# Patient Record
Sex: Male | Born: 1945 | Race: White | Hispanic: No | Marital: Married
Health system: Southern US, Community
[De-identification: ages and names within clinical notes are randomized; demographics above are authoritative.]

---

## 2014-03-24 ENCOUNTER — Other Ambulatory Visit (HOSPITAL_COMMUNITY): Payer: Self-pay | Admitting: Neurosurgery

## 2014-03-24 DIAGNOSIS — M5416 Radiculopathy, lumbar region: Secondary | ICD-10-CM

## 2014-03-31 ENCOUNTER — Ambulatory Visit (HOSPITAL_COMMUNITY)
Admission: RE | Admit: 2014-03-31 | Discharge: 2014-03-31 | Disposition: A | Discharge status: Home / Self Care | Payer: Worker's Compensation | Source: Ambulatory Visit | Attending: Neurosurgery | Admitting: Neurosurgery

## 2014-03-31 DIAGNOSIS — M5416 Radiculopathy, lumbar region: Secondary | ICD-10-CM | POA: Diagnosis present

## 2014-03-31 MED ORDER — DIAZEPAM 5 MG PO TABS
10.0000 mg | ORAL_TABLET | Freq: Once | ORAL | Status: AC
  Administered 2014-03-31: 10 mg via ORAL

## 2014-03-31 MED ORDER — ONDANSETRON HCL 4 MG/2ML IJ SOLN: 4.0000 mg | Freq: Four times a day (QID) | INTRAMUSCULAR | Status: DC | PRN

## 2014-03-31 MED ORDER — DIAZEPAM 5 MG PO TABS
ORAL_TABLET | ORAL | Status: AC
  Filled 2014-03-31: qty 2

## 2014-03-31 MED ORDER — IOHEXOL 180 MG/ML  SOLN
20.0000 mL | Freq: Once | INTRAMUSCULAR | Status: AC | PRN
  Administered 2014-03-31: 16 mL via INTRATHECAL

## 2014-03-31 NOTE — Discharge Instructions (Signed)
Myelography, Care After These instructions give you information on caring for yourself after your procedure. Your doctor may also give you specific instructions. Call your doctor if you have any problems or questions after your procedure. HOME CARE  Rest often the first day.  When you rest, lie flat, with your head slightly raised (elevated).  Avoid heavy lifting and activity for 48 hours. You may take the bandage (dressing) off 1 day after the test.   Myelogram and Lumbar Puncture Discharge Instructions  Go home and rest quietly for the next 24 hours.  It is important to lie flat for the next 24 hours.  Get up only to go to the restroom.  You may lie in the bed or on a couch on your back, your stomach, your left side or your right side.  You may have one pillow under your head.  You may have pillows between your knees while you are on your side or under your knees while you are on your back.Myelogram and Lumbar Puncture Discharge Instructions  Go home and rest quietly for the next 24 hours.  It is important to lie flat for the next 24 hours.  Get up only to go to the restroom.  You may lie in the bed or on a couch on your back, your stomach, your left side or your right side.  You may have one pillow under your head.  You may have pillows between your knees while you are on your side or under your knees while you are on your back.  DO NOT drive today.  Recline the seat as far back as it will go, while still wearing your seat belt, on the way home.  You may get up to go to the bathroom as needed.  You may sit up for 10 minutes to eat.  You may resume your normal diet and medications unless otherwise indicated.  The incidence of headache, nausea, or vomiting is about 5% (one in 20 patients).  If you develop a headache, lie flat and drink plenty of fluids until the headache goes away.  Caffeinated beverages may be helpful.  If you develop severe nausea and vomiting or a headache that does not go  away with flat bed rest, call your Doctor.  You may resume normal activities after your 24 hours of bed rest is over; however, do not exert yourself strongly or do any heavy lifting tomorrow.  Call your physician for a follow-up appointment.  The results of your myelogram will be sent directly to your physician by the following day.  If you have any questions or if complications develop after you arrive home.   Discharge instructions have been explained to the patient.  The patient, or the person responsible for the patient, fully understands these instructions.  1.   2. DO NOT drive today.  Recline the seat as far back as it will go, while still wearing your seat belt, on the way home.  3. You may get up to go to the bathroom as needed.  You may sit up for 10 minutes to eat.  You may resume your normal diet and medications unless otherwise indicated.  4. The incidence of headache, nausea, or vomiting is about 5% (one in 20 patients).  If you develop a headache, lie flat and drink plenty of fluids until the headache goes away.  Caffeinated beverages may be helpful.  If you develop severe nausea and vomiting or a headache that does not go away  with flat bed rest, call your Doctor.  5. You may resume normal activities after your 24 hours of bed rest is over; however, do not exert yourself strongly or do any heavy lifting tomorrow.  6. Call your physician for a follow-up appointment.  The results of your myelogram will be sent directly to your physician by the following day.  7. If you have any questions or if complications develop after you arrive home your doctor.  Discharge instructions have been explained to the patient.  The patient, or the person responsible for the patient, fully understands these instructions.    GET HELP RIGHT AWAY IF:  8. You have a very bad headache. 9. You have a fever. MAKE SURE YOU:  Understand these instructions.  Will watch your condition.  Will get  help right away if you are not doing well or get worse. Document Released: 10/05/2007 Document Revised: 05/12/2013 Document Reviewed: 04/16/2013 [facility] Patient Information 2015 [place], [place]. This information is not intended to replace advice given to you by your health care provider. Make sure you discuss any questions you have with your health care provider.

## 2014-12-09 ENCOUNTER — Other Ambulatory Visit: Payer: Self-pay | Admitting: Neurosurgery

## 2014-12-09 ENCOUNTER — Other Ambulatory Visit (HOSPITAL_COMMUNITY): Payer: Self-pay | Admitting: Neurosurgery

## 2014-12-09 DIAGNOSIS — M5126 Other intervertebral disc displacement, lumbar region: Secondary | ICD-10-CM

## 2014-12-22 ENCOUNTER — Ambulatory Visit (HOSPITAL_COMMUNITY)
Admission: RE | Admit: 2014-12-22 | Discharge: 2014-12-22 | Disposition: A | Discharge status: Home / Self Care | Payer: Worker's Compensation | Source: Ambulatory Visit | Attending: Neurosurgery | Admitting: Neurosurgery

## 2014-12-22 DIAGNOSIS — M545 Low back pain: Secondary | ICD-10-CM | POA: Insufficient documentation

## 2014-12-22 DIAGNOSIS — M5127 Other intervertebral disc displacement, lumbosacral region: Secondary | ICD-10-CM | POA: Diagnosis not present

## 2014-12-22 DIAGNOSIS — I700 Atherosclerosis of aorta: Secondary | ICD-10-CM | POA: Insufficient documentation

## 2014-12-22 DIAGNOSIS — K5730 Diverticulosis of large intestine without perforation or abscess without bleeding: Secondary | ICD-10-CM | POA: Insufficient documentation

## 2014-12-22 DIAGNOSIS — M4806 Spinal stenosis, lumbar region: Secondary | ICD-10-CM | POA: Diagnosis not present

## 2014-12-22 DIAGNOSIS — M5126 Other intervertebral disc displacement, lumbar region: Secondary | ICD-10-CM

## 2014-12-22 DIAGNOSIS — M79604 Pain in right leg: Secondary | ICD-10-CM | POA: Insufficient documentation

## 2014-12-22 MED ORDER — DIAZEPAM 5 MG PO TABS
ORAL_TABLET | ORAL | Status: AC
  Administered 2014-12-22: 10 mg via ORAL
  Filled 2014-12-22: qty 2

## 2014-12-22 MED ORDER — DIAZEPAM 5 MG PO TABS
10.0000 mg | ORAL_TABLET | Freq: Once | ORAL | Status: AC
  Administered 2014-12-22: 10 mg via ORAL

## 2014-12-22 MED ORDER — ONDANSETRON HCL 4 MG/2ML IJ SOLN: 4.0000 mg | Freq: Four times a day (QID) | INTRAMUSCULAR | Status: DC | PRN

## 2014-12-22 MED ORDER — LIDOCAINE HCL (PF) 1 % IJ SOLN
INTRAMUSCULAR | Status: AC
  Administered 2014-12-22: 5 mL via INTRAMUSCULAR
  Filled 2014-12-22: qty 5

## 2014-12-22 MED ORDER — TRAMADOL HCL 50 MG PO TABS
100.0000 mg | ORAL_TABLET | Freq: Four times a day (QID) | ORAL | Status: DC | PRN
  Filled 2014-12-22: qty 2

## 2014-12-22 MED ORDER — IOHEXOL 180 MG/ML  SOLN
20.0000 mL | Freq: Once | INTRAMUSCULAR | Status: AC | PRN
  Administered 2014-12-22: 20 mL via INTRATHECAL

## 2014-12-22 NOTE — Op Note (Signed)
12/22/2014 lumbar Myelogram  PATIENT:  Raymond BoozeKeith Pierce is a 69 y.o. male with right lower extremity pain.   PRE-OPERATIVE DIAGNOSIS:  Right lower extremity pain  POST-OPERATIVE DIAGNOSIS:  Right lower extremity pain  PROCEDURE:  Lumbar Myelogram  SURGEON:  Zeffie Bickert  ANESTHESIA:   local LOCAL MEDICATIONS USED:  LIDOCAINE  Procedure Note: Raymond Pierce is a 69 y.o. male Was taken to the fluoroscopy suite and  positioned prone on the fluoroscopy table. His back was prepared and draped in a sterile manner. I infiltrated 7 cc into the lumbar region. I then introduced a spinal needle into the thecal sac at the L4/5 interlaminar space. I infiltrated 20cc of Omnipaque 180 into the thecal sac. Fluoroscopy showed the needle and contrast in the thecal sac. Raymond Pierce tolerated the procedure well. he Will be taken to CT for evaluation.     PATIENT DISPOSITION:  PACU - hemodynamically stable.

## 2014-12-22 NOTE — Discharge Instructions (Signed)
Myelogram and Lumbar Puncture Discharge Instructions ° °1. Go home and rest quietly for the next 24 hours.  It is important to lie flat for the next 24 hours.  Get up only to go to the restroom.  You may lie in the bed or on a couch on your back, your stomach, your left side or your right side.  You may have one pillow under your head.  You may have pillows between your knees while you are on your side or under your knees while you are on your back. ° °2. DO NOT drive today.  Recline the seat as far back as it will go, while still wearing your seat belt, on the way home. ° °3. You may get up to go to the bathroom as needed.  You may sit up for 10 minutes to eat.  You may resume your normal diet and medications unless otherwise indicated. ° °4. The incidence of headache, nausea, or vomiting is about 5% (one in 20 patients).  If you develop a headache, lie flat and drink plenty of fluids until the headache goes away.  Caffeinated beverages may be helpful.  If you develop severe nausea and vomiting or a headache that does not go away with flat bed rest, call the office. ° °5. You may resume normal activities after your 24 hours of bed rest is over; however, do not exert yourself strongly or do any heavy lifting tomorrow. ° °6. Call your physician for a follow-up appointment.  The results of your myelogram will be sent directly to your physician by the following day. ° °7. If you have any questions or if complications develop after you arrive home, please call the office. ° °Discharge instructions have been explained to the patient.  The patient, or the person responsible for the patient, fully understands these instructions. ° ° °

## 2017-07-12 IMAGING — RF DG MYELOGRAM LUMBAR
9 series · 9 of 9 positions shown · non-contrast
Comparison: Lumbar CT myelogram 03/31/2014.

CLINICAL DATA: 68-year-old male with lumbar back pain radiating to
the right hip and posterior lower extremity stopping at the knee.
Cardiac pacemaker. Subsequent encounter.
TECHNIQUE: Contiguous axial images were obtained through the Lumbar spine after
the intrathecal infusion of infusion. Coronal and sagittal
reconstructions were obtained of the axial image sets.

[Series 1: run · 1 of 1 slices shown (1 of 9)]
[im 1/1]
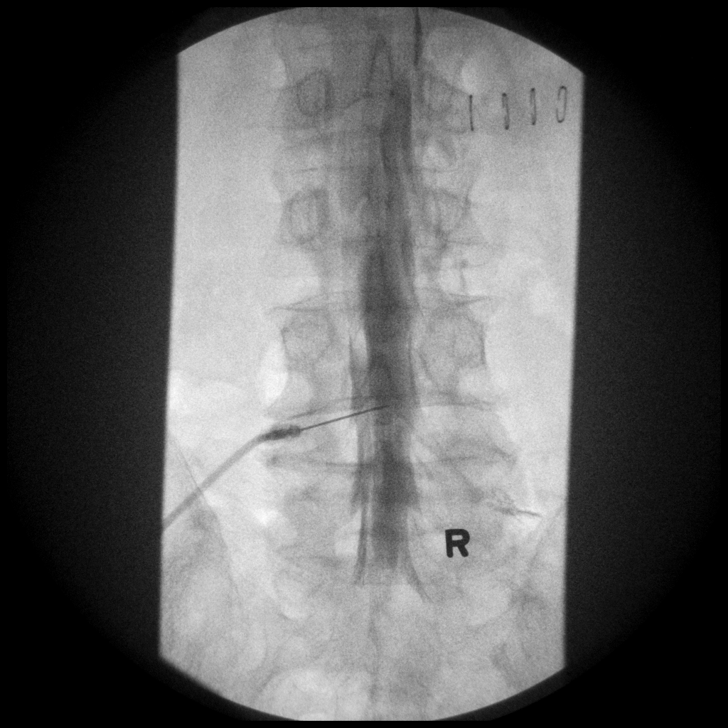

[Series 2: run · 1 of 1 slices shown (2 of 9)]
[im 1/1]
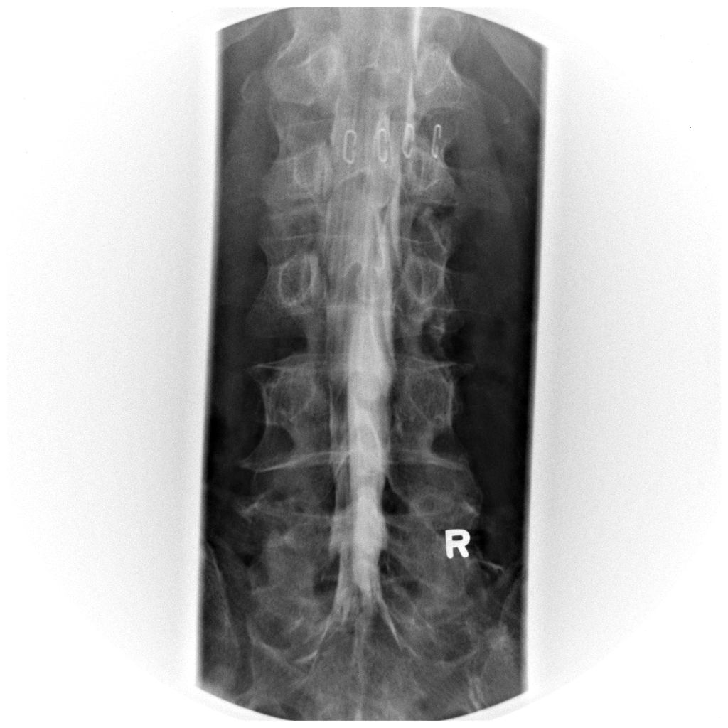

[Series 3: run · 1 of 1 slices shown (3 of 9)]
[im 1/1]
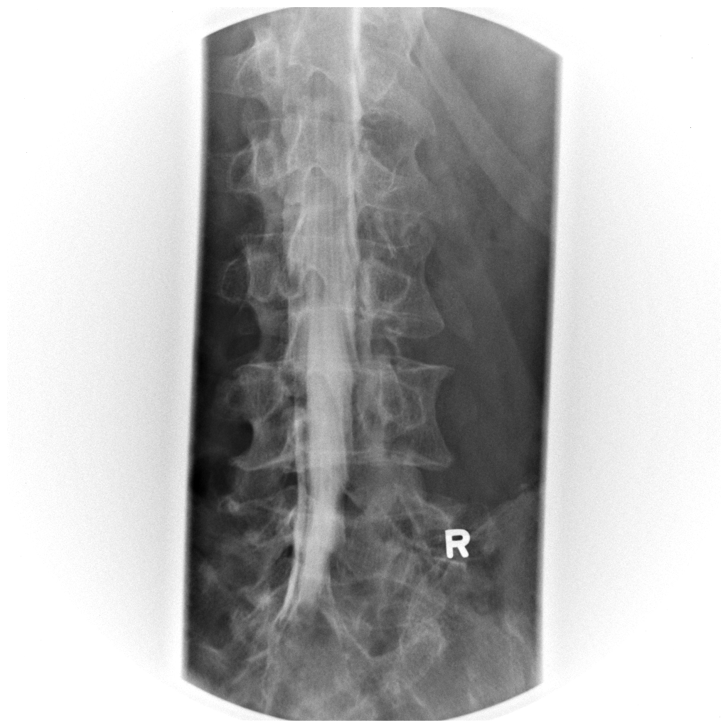

[Series 4: run · 1 of 1 slices shown (4 of 9)]
[im 1/1]
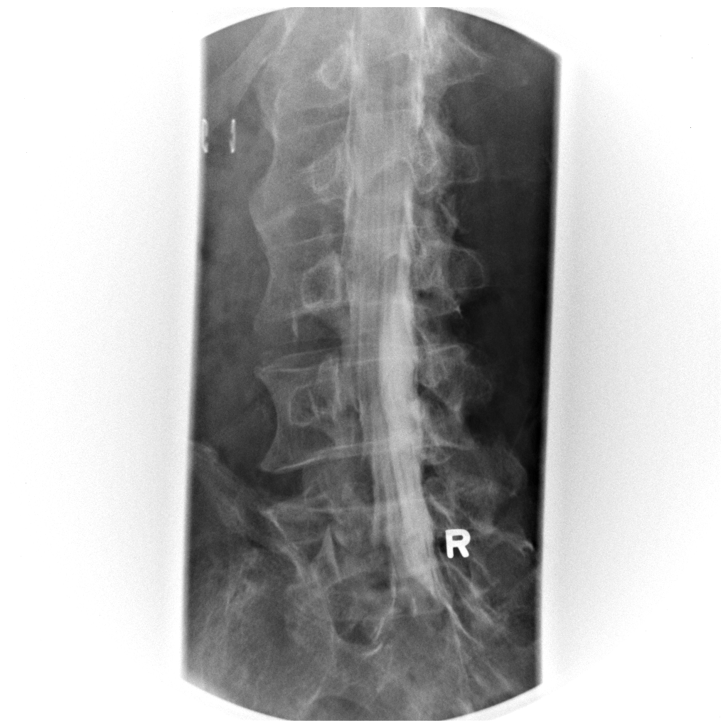

[Series 5: run · 1 of 1 slices shown (5 of 9)]
[im 1/1]
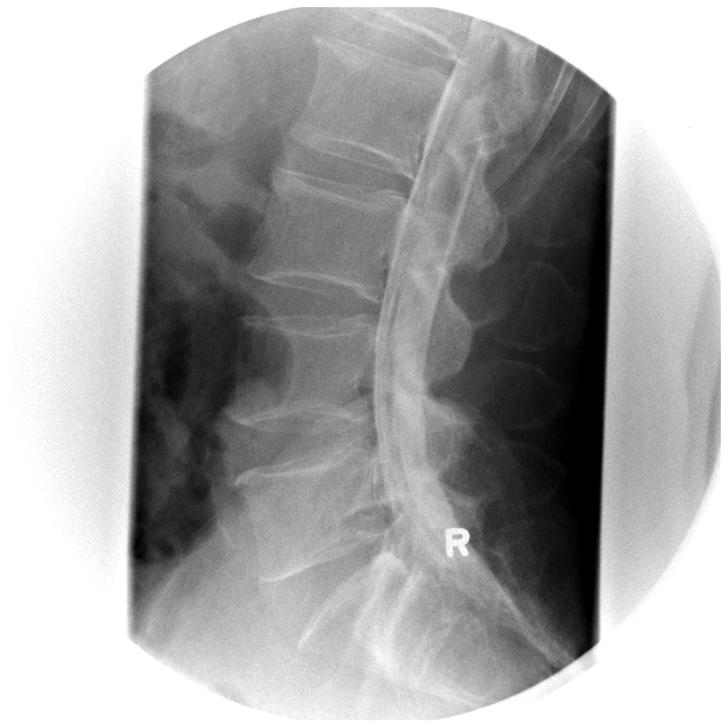

[Series 6: run · 1 of 1 slices shown (6 of 9)]
[im 1/1]
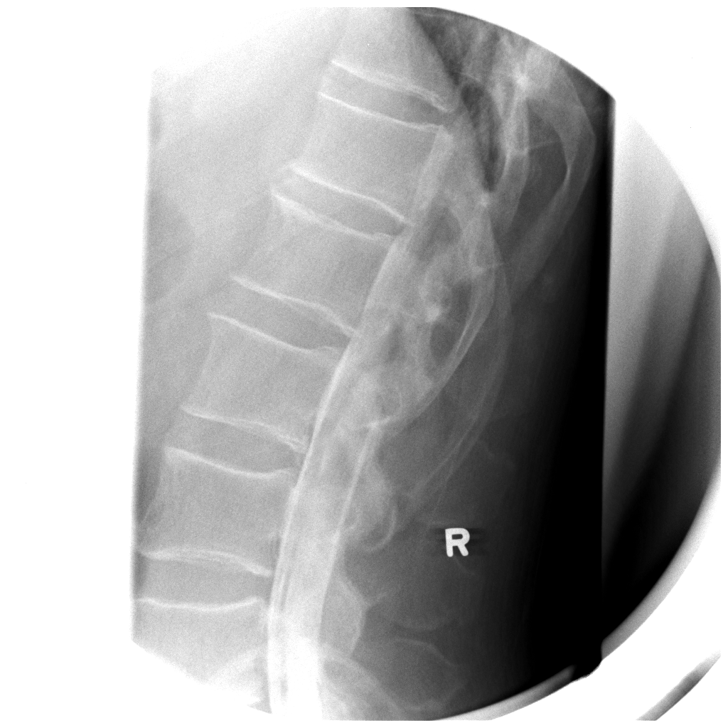

[Series 7: run · 1 of 1 slices shown (7 of 9)]
[im 1/1]
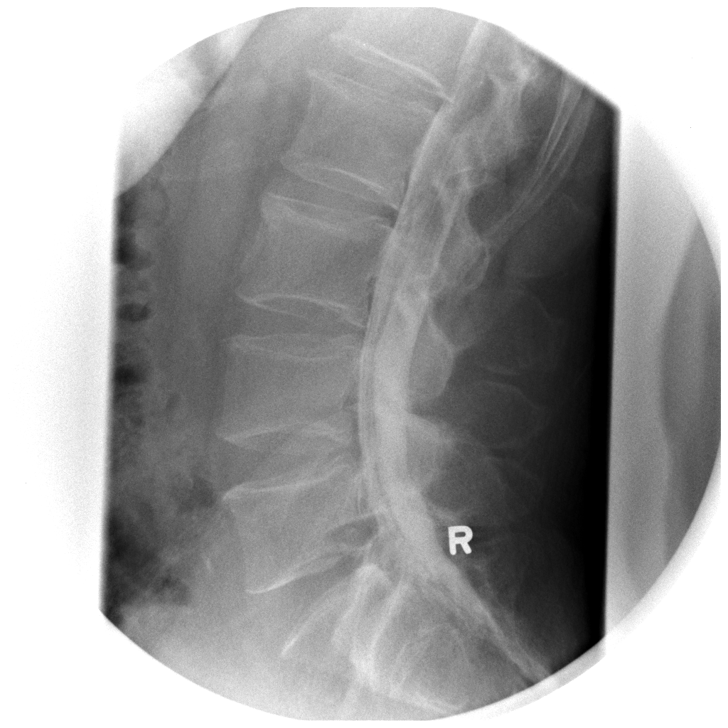

[Series 8: run · 1 of 1 slices shown (8 of 9)]
[im 1/1]
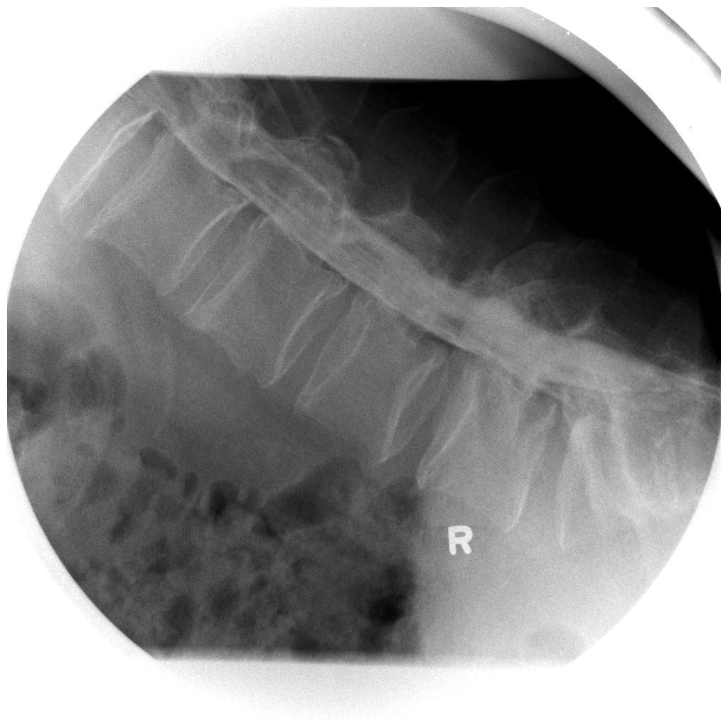

[Series 9: run · 1 of 1 slices shown (9 of 9)]
[im 1/1]
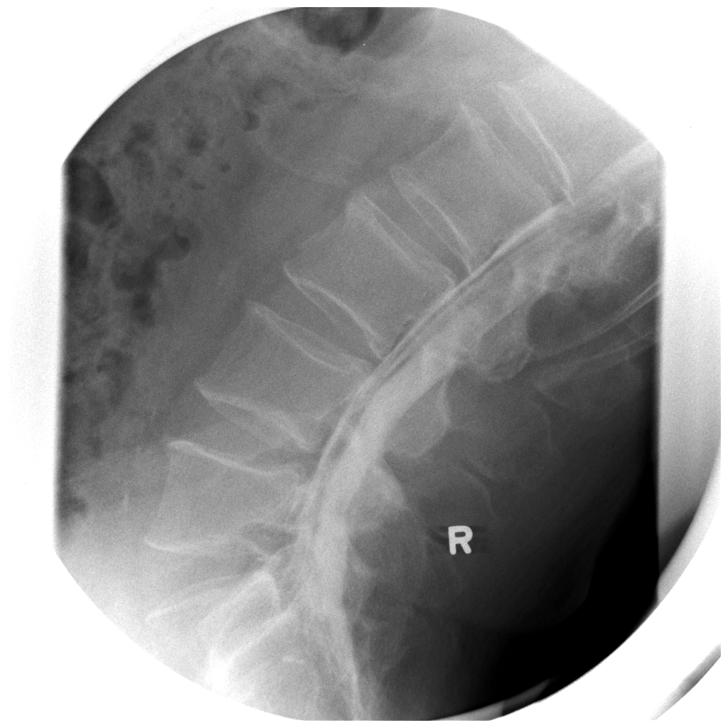

[9 of 9 positions shown; findings below may reference images not displayed]

EXAM:
LUMBAR MYELOGRAM

FLUOROSCOPY TIME:  0 minutes 40 seconds

PROCEDURE:
Lumbar puncture and intrathecal contrast administration were
performed by Dr. PATRIS TOTOH who will separately report for the
portion of the procedure. I personally supervised acquisition of the
myelogram images.

I personally supervised acquisition of the myelogram images.
FINDINGS: LUMBAR MYELOGRAM FINDINGS:

Normal lumbar segmentation. Mixed injection today, but adequate
intrathecal contrast. Overall thecal sac size in patency appears
stable since [REDACTED]. Stable vertebral height and alignment. No
lateral recess stenosis identified.

Standing views in neutral flexion and extension. Good range of
motion demonstrated. No abnormal motion.

CT LUMBAR MYELOGRAM FINDINGS:

Mild calcified aortic atherosclerosis re- demonstrated. Mild to
moderate diverticulosis of the sigmoid colon. Otherwise negative
visualized abdominal viscera; small volume in trapped as a did
contrast excretion by the kidneys.

Stable lumbar vertebral height and alignment. Stable bone
mineralization. No acute osseous abnormality identified.

Mixed contrast injection, dorsal subdural injection from the lower
thoracic spine to the L1-L2 disc space level and to a lesser extent
dorsally in the lumbar spine. Normal myelographic appearance of the
lower thoracic spinal cord. Conus at L1-L2.

T12-L1:  Stable mild anterior disc osteophyte complex.  No stenosis.

L1-L2: Stable mild anterior eccentric disc bulge and endplate
spurring. No stenosis.

L2-L3: Stable mild anterior disc bulge and spurring. Mild to
moderate facet hypertrophy greater on the right is stable. No spinal
or lateral recess stenosis. No foraminal stenosis.

L3-L4: Stable circumferential disc bulge eccentric to the right.
Moderate facet hypertrophy greater on the right is stable. No spinal
or convincing lateral recess stenosis. Mild right L3 foraminal
stenosis appears stable and is best seen on sagittal image 14.

L4-L5: Chronic circumferential disc bulge. Mild to moderate ligament
flavum hypertrophy. Suggestion of superimposed small left lateral
recess to subarticular disc protrusion on series 4, image 82 which
is new or increased. There is effacement of the left lateral recess.
No right lateral recess stenosis. No significant spinal stenosis.
Borderline to mild L4 foraminal stenosis is stable.

L5-S1: Mild circumferential disc bulge and epidural lipomatosis are
stable. As before, there does appear to be a right paracentral disc
protrusion abutting the descending right S1 nerve roots in the
lateral recess (series 4, image 96). No definite nerve root mass
effect. No significant spinal stenosis. No significant foraminal
stenosis.
IMPRESSION: LUMBAR MYELOGRAM IMPRESSION:

1. No lumbar spinal stenosis or acute osseous abnormality.
2. Normal appearance in flexion and extension.

CT LUMBAR MYELOGRAM IMPRESSION:

1. New or increased small left subarticular disc protrusion
suspected at L4-L5, at the level of the descending left L5 nerve
roots. Borderline to mild bilateral L4 foraminal stenosis is stable.
2. Stable small right lateral recess disc protrusion at L5-S1
without definite right S1 nerve root mass effect.
3. Mild right L3 foraminal stenosis appears stable and is best seen
on sagittal image 14.
Imaging findings of potential clinical significance:

Mild calcified aortic atherosclerosis.  Diverticulosis of the colon.
